# Patient Record
Sex: Male | Born: 2000 | Race: White | Hispanic: No | Marital: Single | Smoking: Never smoker
Health system: Southern US, Community
[De-identification: ages and names within clinical notes are randomized; demographics above are authoritative.]

## PROBLEM LIST (undated history)

## (undated) DIAGNOSIS — T7840XA Allergy, unspecified, initial encounter: Secondary | ICD-10-CM

## (undated) HISTORY — DX: Allergy, unspecified, initial encounter: T78.40XA

---
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
## 2022-07-24 DIAGNOSIS — L700 Acne vulgaris: Secondary | ICD-10-CM | POA: Diagnosis not present

## 2022-07-24 DIAGNOSIS — L720 Epidermal cyst: Secondary | ICD-10-CM | POA: Diagnosis not present

## 2022-08-05 DIAGNOSIS — L720 Epidermal cyst: Secondary | ICD-10-CM | POA: Diagnosis not present

## 2023-03-10 DIAGNOSIS — H52223 Regular astigmatism, bilateral: Secondary | ICD-10-CM | POA: Diagnosis not present

## 2023-03-10 DIAGNOSIS — H5213 Myopia, bilateral: Secondary | ICD-10-CM | POA: Diagnosis not present

## 2023-07-16 DIAGNOSIS — L910 Hypertrophic scar: Secondary | ICD-10-CM | POA: Diagnosis not present

## 2023-10-23 DIAGNOSIS — J019 Acute sinusitis, unspecified: Secondary | ICD-10-CM | POA: Diagnosis not present

## 2023-10-23 DIAGNOSIS — R051 Acute cough: Secondary | ICD-10-CM | POA: Diagnosis not present

## 2023-12-12 DIAGNOSIS — J209 Acute bronchitis, unspecified: Secondary | ICD-10-CM | POA: Diagnosis not present

## 2023-12-12 DIAGNOSIS — R051 Acute cough: Secondary | ICD-10-CM | POA: Diagnosis not present

## 2023-12-17 DIAGNOSIS — L910 Hypertrophic scar: Secondary | ICD-10-CM | POA: Diagnosis not present

## 2024-01-15 DIAGNOSIS — L910 Hypertrophic scar: Secondary | ICD-10-CM | POA: Diagnosis not present

## 2024-02-26 DIAGNOSIS — L910 Hypertrophic scar: Secondary | ICD-10-CM | POA: Diagnosis not present

## 2024-02-26 DIAGNOSIS — L738 Other specified follicular disorders: Secondary | ICD-10-CM | POA: Diagnosis not present

## 2024-03-10 DIAGNOSIS — S01402A Unspecified open wound of left cheek and temporomandibular area, initial encounter: Secondary | ICD-10-CM | POA: Diagnosis not present

## 2024-05-02 DIAGNOSIS — L910 Hypertrophic scar: Secondary | ICD-10-CM | POA: Diagnosis not present

## 2024-05-30 DIAGNOSIS — J301 Allergic rhinitis due to pollen: Secondary | ICD-10-CM | POA: Diagnosis not present

## 2024-05-31 DIAGNOSIS — T781XXA Other adverse food reactions, not elsewhere classified, initial encounter: Secondary | ICD-10-CM | POA: Diagnosis not present

## 2024-08-04 DIAGNOSIS — L905 Scar conditions and fibrosis of skin: Secondary | ICD-10-CM | POA: Diagnosis not present

## 2024-08-04 DIAGNOSIS — D692 Other nonthrombocytopenic purpura: Secondary | ICD-10-CM | POA: Diagnosis not present

## 2024-11-03 DIAGNOSIS — J029 Acute pharyngitis, unspecified: Secondary | ICD-10-CM | POA: Diagnosis not present

## 2024-11-03 DIAGNOSIS — R6889 Other general symptoms and signs: Secondary | ICD-10-CM | POA: Diagnosis not present

## 2024-11-22 DIAGNOSIS — J029 Acute pharyngitis, unspecified: Secondary | ICD-10-CM | POA: Diagnosis not present

## 2024-11-22 DIAGNOSIS — Z03818 Encounter for observation for suspected exposure to other biological agents ruled out: Secondary | ICD-10-CM | POA: Diagnosis not present

## 2024-12-12 DIAGNOSIS — H5213 Myopia, bilateral: Secondary | ICD-10-CM | POA: Diagnosis not present

## 2024-12-12 DIAGNOSIS — H52223 Regular astigmatism, bilateral: Secondary | ICD-10-CM | POA: Diagnosis not present
